# Patient Record
Sex: Male | Born: 1985 | Race: Black or African American | Hispanic: No | Marital: Married | Smoking: Current some day smoker
Health system: Southern US, Community
[De-identification: ages and names within clinical notes are randomized; demographics above are authoritative.]

## PROBLEM LIST (undated history)

## (undated) DIAGNOSIS — R569 Unspecified convulsions: Secondary | ICD-10-CM

## (undated) DIAGNOSIS — Z87442 Personal history of urinary calculi: Secondary | ICD-10-CM

## (undated) DIAGNOSIS — R200 Anesthesia of skin: Secondary | ICD-10-CM

## (undated) DIAGNOSIS — R202 Paresthesia of skin: Secondary | ICD-10-CM

## (undated) HISTORY — PX: CYSTOSCOPY: SUR368

---

## 2006-02-26 HISTORY — PX: EYE SURGERY: SHX253

## 2012-08-02 ENCOUNTER — Emergency Department (HOSPITAL_COMMUNITY)
Admission: EM | Admit: 2012-08-02 | Discharge: 2012-08-02 | Disposition: A | Discharge status: Home / Self Care | Payer: BC Managed Care – PPO | Attending: Emergency Medicine | Admitting: Emergency Medicine

## 2012-08-02 ENCOUNTER — Emergency Department (HOSPITAL_COMMUNITY): Payer: BC Managed Care – PPO

## 2012-08-02 DIAGNOSIS — N200 Calculus of kidney: Secondary | ICD-10-CM | POA: Insufficient documentation

## 2012-08-02 DIAGNOSIS — Z79899 Other long term (current) drug therapy: Secondary | ICD-10-CM | POA: Insufficient documentation

## 2012-08-02 LAB — CBC WITH DIFFERENTIAL/PLATELET
Basophils Absolute: 0 K/uL (ref 0.0–0.1)
Basophils Relative: 0 % (ref 0–1)
Eosinophils Absolute: 0 K/uL (ref 0.0–0.7)
Eosinophils Relative: 0 % (ref 0–5)
HCT: 40.7 % (ref 39.0–52.0)
Hemoglobin: 13.9 g/dL (ref 13.0–17.0)
Lymphocytes Relative: 13 % (ref 12–46)
Lymphs Abs: 1.5 K/uL (ref 0.7–4.0)
MCH: 30.8 pg (ref 26.0–34.0)
MCHC: 34.2 g/dL (ref 30.0–36.0)
MCV: 90.2 fL (ref 78.0–100.0)
Monocytes Absolute: 0.5 K/uL (ref 0.1–1.0)
Monocytes Relative: 4 % (ref 3–12)
Neutro Abs: 9.3 K/uL — ABNORMAL HIGH (ref 1.7–7.7)
Neutrophils Relative %: 83 % — ABNORMAL HIGH (ref 43–77)
Platelets: 193 K/uL (ref 150–400)
RBC: 4.51 MIL/uL (ref 4.22–5.81)
RDW: 13.1 % (ref 11.5–15.5)
WBC: 11.3 K/uL — ABNORMAL HIGH (ref 4.0–10.5)

## 2012-08-02 LAB — URINALYSIS, ROUTINE W REFLEX MICROSCOPIC
Glucose, UA: NEGATIVE mg/dL
Ketones, ur: >80 mg/dL — AB
Leukocytes, UA: NEGATIVE
Nitrite: NEGATIVE
Protein, ur: NEGATIVE mg/dL
Urobilinogen, UA: 1 mg/dL (ref 0.0–1.0)

## 2012-08-02 LAB — URINE MICROSCOPIC-ADD ON

## 2012-08-02 LAB — POCT I-STAT, CHEM 8
Calcium, Ion: 1.17 mmol/L (ref 1.12–1.23)
Creatinine, Ser: 2 mg/dL — ABNORMAL HIGH (ref 0.50–1.35)
Glucose, Bld: 96 mg/dL (ref 70–99)
Hemoglobin: 13.3 g/dL (ref 13.0–17.0)
Potassium: 4 mEq/L (ref 3.5–5.1)

## 2012-08-02 LAB — COMPREHENSIVE METABOLIC PANEL
ALT: 40 U/L (ref 0–53)
AST: 67 U/L — ABNORMAL HIGH (ref 0–37)
Albumin: 4.5 g/dL (ref 3.5–5.2)
Calcium: 10.2 mg/dL (ref 8.4–10.5)
Creatinine, Ser: 1.82 mg/dL — ABNORMAL HIGH (ref 0.50–1.35)
GFR calc non Af Amer: 49 mL/min — ABNORMAL LOW (ref >90)
Sodium: 137 mEq/L (ref 135–145)
Total Protein: 7.4 g/dL (ref 6.0–8.3)

## 2012-08-02 MED ORDER — SODIUM CHLORIDE 0.9 % IV SOLN
INTRAVENOUS | Status: DC
  Administered 2012-08-02: 21:00:00 via INTRAVENOUS

## 2012-08-02 MED ORDER — OXYCODONE-ACETAMINOPHEN 7.5-325 MG PO TABS: 1.0000 | ORAL_TABLET | ORAL | Status: AC | PRN

## 2012-08-02 MED ORDER — KETOROLAC TROMETHAMINE 30 MG/ML IJ SOLN
30.0000 mg | Freq: Once | INTRAMUSCULAR | Status: AC
  Administered 2012-08-02: 30 mg via INTRAVENOUS
  Filled 2012-08-02: qty 1

## 2012-08-02 MED ORDER — HYDROMORPHONE HCL PF 1 MG/ML IJ SOLN
1.0000 mg | Freq: Once | INTRAMUSCULAR | Status: AC
  Administered 2012-08-02: 1 mg via INTRAVENOUS
  Filled 2012-08-02: qty 1

## 2012-08-02 MED ORDER — TAMSULOSIN HCL 0.4 MG PO CAPS: 0.4000 mg | ORAL_CAPSULE | Freq: Every day | ORAL | Status: AC

## 2012-08-02 MED ORDER — ONDANSETRON HCL 4 MG/2ML IJ SOLN
4.0000 mg | Freq: Once | INTRAMUSCULAR | Status: AC
  Administered 2012-08-02: 4 mg via INTRAVENOUS
  Filled 2012-08-02: qty 2

## 2012-08-02 NOTE — ED Notes (Signed)
Pt states c/o left side flank pain with difficulty voiding (states able to void unable to push). Denies hematuria.

## 2012-08-05 ENCOUNTER — Telehealth (HOSPITAL_COMMUNITY): Payer: Self-pay | Admitting: Emergency Medicine

## 2012-08-05 NOTE — ED Notes (Signed)
FM was asked by Dr Radford Pax to call pt to have him return for a recheck and to repeat blood work if he has'nt been seen by an Insurance underwriter.  Spoke w/pt asked him to return for f/u and repeat blood work to [employer] or [employer].  Pt stated he will return today.

## 2012-08-05 NOTE — ED Provider Notes (Signed)
History     CSN: [id number]  Arrival date & time 08/02/12  1925   First MD Initiated Contact with Patient 08/02/12 1948      Chief Complaint  Patient presents with  . Flank Pain     HPI Patient began having left-sided flank pain or difficulty voiding started today.  No previous history of kidney stones.  Patient is no other previous medical problems.  No current medications.  Patient denies fever chills or dysuria. No past medical history on file.  No past surgical history on file.  No family history on file.  History  Substance Use Topics  . Smoking status: Not on file  . Smokeless tobacco: Not on file  . Alcohol Use: Not on file      Review of Systems All other systems reviewed and are negative Allergies  Review of patient's allergies indicates no known allergies.  Home Medications   Current Outpatient Rx  Name  Route  Sig  Dispense  Refill  . ibuprofen (ADVIL,MOTRIN) 200 MG tablet   Oral   Take 400 mg by mouth every 6 (six) hours as needed for pain.         Marland Kitchen oxyCODONE-acetaminophen (PERCOCET) 7.5-325 MG per tablet   Oral   Take 1 tablet by mouth every 4 (four) hours as needed for pain.   30 tablet   0   . tamsulosin (FLOMAX) 0.4 MG CAPS   Oral   Take 1 capsule (0.4 mg total) by mouth daily.   20 capsule   0     BP 116/67  Pulse 71  Temp(Src) 98.3 F (36.8 C) (Oral)  Resp 18  SpO2 100%  Physical Exam  Nursing note and vitals reviewed. Constitutional: He is oriented to person, place, and time. He appears well-developed and well-nourished. He appears distressed (Patient uncomfortable with pain).  HENT:  Head: Normocephalic and atraumatic.  Eyes: Pupils are equal, round, and reactive to light.  Neck: Normal range of motion.  Cardiovascular: Normal rate and intact distal pulses.   Pulmonary/Chest: No respiratory distress.  Abdominal: Normal appearance. He exhibits no distension. There is no tenderness. There is no rebound.  Musculoskeletal:  Normal range of motion.  Neurological: He is alert and oriented to person, place, and time. No cranial nerve deficit.  Skin: Skin is warm and dry. No rash noted.  Psychiatric: He has a normal mood and affect. His behavior is normal.    ED Course  Procedures (including critical care time)  Labs Reviewed  URINALYSIS, ROUTINE W REFLEX MICROSCOPIC - Abnormal; Notable for the following:    APPearance TURBID (*)    Specific Gravity, Urine 1.031 (*)    Hgb urine dipstick TRACE (*)    Bilirubin Urine SMALL (*)    Ketones, ur >80 (*)    All other components within normal limits  COMPREHENSIVE METABOLIC PANEL - Abnormal; Notable for the following:    Glucose, Bld 102 (*)    Creatinine, Ser 1.82 (*)    AST 67 (*)    GFR calc non Af Amer 49 (*)    GFR calc Af Amer 57 (*)    All other components within normal limits  CBC WITH DIFFERENTIAL - Abnormal; Notable for the following:    WBC 11.3 (*)    Neutrophils Relative % 83 (*)    Neutro Abs 9.3 (*)    All other components within normal limits  POCT I-STAT, CHEM 8 - Abnormal; Notable for the following:    Creatinine,  Ser 2.00 (*)    All other components within normal limits  URINE MICROSCOPIC-ADD ON   No results found.   1. Kidney stone       MDM  Patient felt much improved and wanted to go home.  Followup instructions with referral to urology was made.  Patient to return if pain worsens or he develops nausea vomiting or fever.  Patient given urology followup.  Review of creatinine function dictates a return call or followup phone call to assure the patient is promptly followed.  It is not seen her urologist requested a return to emergency department for reevaluation and repeat renal function evaluation.      Nelia Shi, MD 08/05/12 [phone]

## 2016-06-24 ENCOUNTER — Encounter (HOSPITAL_COMMUNITY): Payer: Self-pay

## 2016-06-24 ENCOUNTER — Emergency Department (HOSPITAL_COMMUNITY): Payer: Managed Care, Other (non HMO)

## 2016-06-24 ENCOUNTER — Emergency Department (HOSPITAL_COMMUNITY)
Admission: EM | Admit: 2016-06-24 | Discharge: 2016-06-24 | Disposition: A | Discharge status: Home / Self Care | Payer: Managed Care, Other (non HMO) | Attending: Emergency Medicine | Admitting: Emergency Medicine

## 2016-06-24 DIAGNOSIS — Y9367 Activity, basketball: Secondary | ICD-10-CM | POA: Insufficient documentation

## 2016-06-24 DIAGNOSIS — S99911A Unspecified injury of right ankle, initial encounter: Secondary | ICD-10-CM | POA: Diagnosis present

## 2016-06-24 DIAGNOSIS — Y999 Unspecified external cause status: Secondary | ICD-10-CM | POA: Insufficient documentation

## 2016-06-24 DIAGNOSIS — Y929 Unspecified place or not applicable: Secondary | ICD-10-CM | POA: Diagnosis not present

## 2016-06-24 DIAGNOSIS — S86011A Strain of right Achilles tendon, initial encounter: Secondary | ICD-10-CM | POA: Diagnosis not present

## 2016-06-24 DIAGNOSIS — X58XXXA Exposure to other specified factors, initial encounter: Secondary | ICD-10-CM | POA: Insufficient documentation

## 2016-06-24 MED ORDER — ACETAMINOPHEN 325 MG PO TABS: 650.0000 mg | ORAL_TABLET | Freq: Four times a day (QID) | ORAL | 0 refills | Status: AC | PRN

## 2016-06-24 MED ORDER — IBUPROFEN 600 MG PO TABS: 600.0000 mg | ORAL_TABLET | Freq: Four times a day (QID) | ORAL | 0 refills | Status: AC | PRN

## 2016-06-24 NOTE — ED Notes (Signed)
Declined W/C at D/C and was escorted to lobby by RN. 

## 2016-06-24 NOTE — ED Provider Notes (Signed)
[facility]-EMERGENCY DEPT Provider Note    By signing my name below, I, Kenneth Reid, attest that this documentation has been prepared under the direction and in the presence of Derwood Kaplan, MD. Electronically Signed: Earmon Reid, ED Scribe. 06/24/16. 4:17 PM.    History   Chief Complaint No chief complaint on file.  The history is provided by the patient and medical records. No language interpreter was used.    Jorge Retz is a 31 y.o. male who presents to the Emergency Department complaining of posterior right ankle pain that began about two hours ago while playing basketball. He reports hearing an associated popping sensation and some swelling of the right ankle. He has not taken anything for pain. Waking increases the pain. He denies alleviating factors. He denies numbness, tingling or weakness of the RLE or foot, bruising or wounds.   History reviewed. No pertinent past medical history.  There are no active problems to display for this patient.   History reviewed. No pertinent surgical history.     Home Medications    Prior to Admission medications   Medication Sig Start Date End Date Taking? Authorizing Provider  acetaminophen (TYLENOL) 325 MG tablet Take 2 tablets (650 mg total) by mouth every 6 (six) hours as needed. 06/24/16   Derwood Kaplan, MD  ibuprofen (ADVIL,MOTRIN) 600 MG tablet Take 1 tablet (600 mg total) by mouth every 6 (six) hours as needed. 06/24/16   Derwood Kaplan, MD  oxyCODONE-acetaminophen (PERCOCET) 7.5-325 MG per tablet Take 1 tablet by mouth every 4 (four) hours as needed for pain. 08/02/12   Nelva Nay, MD  tamsulosin (FLOMAX) 0.4 MG CAPS Take 1 capsule (0.4 mg total) by mouth daily. 08/02/12   Nelva Nay, MD    Family History No family history on file.  Social History Social History  Substance Use Topics  . Smoking status: Never Smoker  . Smokeless tobacco: Never Used  . Alcohol use Not on file     Allergies   Patient has no  known allergies.   Review of Systems Review of Systems  Musculoskeletal: Positive for arthralgias.  Skin: Negative for color change and wound.  Neurological: Negative for weakness and numbness.     Physical Exam Updated Vital Signs BP 116/86   Pulse 80   Temp 98.9 F (37.2 C) (Oral)   Resp 18   SpO2 100%   Physical Exam  Constitutional: He is oriented to person, place, and time. He appears well-developed and well-nourished.  HENT:  Head: Normocephalic.  Eyes: EOM are normal.  Neck: Normal range of motion.  Pulmonary/Chest: Effort normal.  Abdominal: He exhibits no distension.  Musculoskeletal: Normal range of motion.  No tenderness to palpation over anterior, medial or lateral malleoli or anterior ankle. Able to plantar and dorsiflex with discomfort. Positive Thompson's test.  Neurological: He is alert and oriented to person, place, and time.  Psychiatric: He has a normal mood and affect.  Nursing note and vitals reviewed.    ED Treatments / Results  DIAGNOSTIC STUDIES: Oxygen Saturation is 100% on RA, normal by my interpretation.   COORDINATION OF CARE: 3:50 PM- Will do bedside ultrasound. Pt verbalizes understanding and agrees to plan.  Medications - No data to display  Labs (all labs ordered are listed, but only abnormal results are displayed) Labs Reviewed - No data to display  EKG  EKG Interpretation None       Radiology Dg Ankle Complete Right  Result Date: 06/24/2016 CLINICAL DATA:  Posterior right ankle  pain. EXAM: RIGHT ANKLE - COMPLETE 3+ VIEW COMPARISON:  None. FINDINGS: There is no evidence of fracture, dislocation, or joint effusion. There is no evidence of arthropathy or other focal bone abnormality. Mild soft tissue thickening is seen posteriorly, slightly superior to the level of the ankle mortise. IMPRESSION: No acute fracture or dislocation identified about the right ankle. Posterior soft tissue edema. Electronically Signed   By: Ted Mcalpine M.D.   On: 06/24/2016 15:33    Procedures [place] bedside Date/Time: 06/24/2016 4:16 PM Performed by: Derwood Kaplan Authorized by: Derwood Kaplan  Consent: Verbal consent obtained. Consent given by: patient Patient understanding: patient states understanding of the procedure being performed Patient consent: the patient's understanding of the procedure matches consent given Procedure consent: procedure consent matches procedure scheduled Relevant documents: relevant documents present and verified Test results: test results available and properly labeled Imaging studies: imaging studies available Required items: required blood products, implants, devices, and special equipment available Patient identity confirmed: verbally with patient Preparation: Patient was prepped and draped in the usual sterile fashion. Local anesthesia used: no  Anesthesia: Local anesthesia used: no  Sedation: Patient sedated: no Patient tolerance: Patient tolerated the procedure well with no immediate complications Comments: ULTRASOUND LIMITED SOFT TISSUE/ MUSCULOSKELETAL: right achilles Indication: right ankle injury Linear probe used to evaluate area of interest in two planes. Findings:  Clear evidence of tendon fibers not extending to the distal leg, and edema Performed by: Dr Rhunette Croft Images saved electronically     (including critical care time)  Medications Ordered in ED Medications - No data to display   Initial Impression / Assessment and Plan / ED Course  I have reviewed the triage vital signs and the nursing notes.  Pertinent labs & imaging results that were available during my care of the patient were reviewed by me and considered in my medical decision making (see chart for details).    Pt has likely suffered achilles tendon rupture. We will d.c with ortho f/u. Non weight bearing.   Final Clinical Impressions(s) / ED Diagnoses   Final diagnoses:  Rupture of right  Achilles tendon, initial encounter    New Prescriptions New Prescriptions   ACETAMINOPHEN (TYLENOL) 325 MG TABLET    Take 2 tablets (650 mg total) by mouth every 6 (six) hours as needed.   IBUPROFEN (ADVIL,MOTRIN) 600 MG TABLET    Take 1 tablet (600 mg total) by mouth every 6 (six) hours as needed.    I personally performed the services described in this documentation, which was scribed in my presence. The recorded information has been reviewed and is accurate.     Derwood Kaplan, MD 06/24/16 [phone]

## 2016-06-24 NOTE — Progress Notes (Signed)
Orthopedic Tech Progress Note Patient Details:  Kenneth Reid [birth year] [id number]  Ortho Devices Type of Ortho Device: Ace wrap, Post (short leg) splint Ortho Device/Splint Location: RLE Ortho Device/Splint Interventions: Ordered, Application   Jennye Moccasin 06/24/2016, 5:18 PM

## 2016-06-24 NOTE — ED Triage Notes (Signed)
Patient complains of posterior right ankle pain after injuring same playing basketball, pain with ambulation

## 2016-06-24 NOTE — Discharge Instructions (Signed)
You likely have rupture of the achilles tendon. NO WEIGHT BEARING - USE THE CRUTCHES. KEEP THE LEG IN SPLINT AND SEE ORTHOPEDIC doctor in 1 week.  Keep the leg elevated.

## 2016-06-24 NOTE — Progress Notes (Signed)
Orthopedic Tech Progress Note Patient Details:  Winfield Weathersby 10/24/1985 [id number]  Ortho Devices Type of Ortho Device: Ace wrap, Post (short leg) splint Ortho Device/Splint Location: RLE Ortho Device/Splint Interventions: Ordered, Application   Dorthea Maina Craig 06/24/2016, 5:18 PM  

## 2016-06-24 NOTE — Progress Notes (Signed)
Orthopedic Tech Progress Note Patient Details:  Kenneth Reid 1985-04-20 [id number]  Ortho Devices Type of Ortho Device: ASO, Crutches Ortho Device/Splint Location: RLE Ortho Device/Splint Interventions: Ordered, Application   Jennye Moccasin 06/24/2016, 4:54 PM

## 2016-07-11 ENCOUNTER — Encounter (HOSPITAL_BASED_OUTPATIENT_CLINIC_OR_DEPARTMENT_OTHER): Payer: Self-pay | Admitting: *Deleted

## 2016-07-11 NOTE — Progress Notes (Signed)
Pt instructed npo pmn 5/21.  [facility] 1045.  Needs hgb on arrival.

## 2016-07-17 ENCOUNTER — Ambulatory Visit (HOSPITAL_BASED_OUTPATIENT_CLINIC_OR_DEPARTMENT_OTHER): Payer: Managed Care, Other (non HMO) | Admitting: Anesthesiology

## 2016-07-17 ENCOUNTER — Ambulatory Visit (HOSPITAL_BASED_OUTPATIENT_CLINIC_OR_DEPARTMENT_OTHER)
Admission: RE | Admit: 2016-07-17 | Discharge: 2016-07-17 | Disposition: A | Discharge status: Home / Self Care | Payer: Managed Care, Other (non HMO) | Source: Ambulatory Visit | Attending: Orthopedic Surgery | Admitting: Orthopedic Surgery

## 2016-07-17 ENCOUNTER — Encounter (HOSPITAL_BASED_OUTPATIENT_CLINIC_OR_DEPARTMENT_OTHER)
Admission: RE | Disposition: A | Discharge status: Home / Self Care | Payer: Self-pay | Source: Ambulatory Visit | Attending: Orthopedic Surgery

## 2016-07-17 ENCOUNTER — Encounter (HOSPITAL_BASED_OUTPATIENT_CLINIC_OR_DEPARTMENT_OTHER): Payer: Self-pay | Admitting: Anesthesiology

## 2016-07-17 DIAGNOSIS — F17200 Nicotine dependence, unspecified, uncomplicated: Secondary | ICD-10-CM | POA: Insufficient documentation

## 2016-07-17 DIAGNOSIS — S86011A Strain of right Achilles tendon, initial encounter: Secondary | ICD-10-CM | POA: Diagnosis present

## 2016-07-17 DIAGNOSIS — Y9239 Other specified sports and athletic area as the place of occurrence of the external cause: Secondary | ICD-10-CM | POA: Diagnosis not present

## 2016-07-17 DIAGNOSIS — Z87442 Personal history of urinary calculi: Secondary | ICD-10-CM | POA: Insufficient documentation

## 2016-07-17 DIAGNOSIS — Y9367 Activity, basketball: Secondary | ICD-10-CM | POA: Diagnosis not present

## 2016-07-17 HISTORY — DX: Anesthesia of skin: R20.0

## 2016-07-17 HISTORY — DX: Unspecified convulsions: R56.9

## 2016-07-17 HISTORY — PX: ACHILLES TENDON SURGERY: SHX542

## 2016-07-17 HISTORY — DX: Paresthesia of skin: R20.2

## 2016-07-17 HISTORY — DX: Personal history of urinary calculi: Z87.442

## 2016-07-17 LAB — POCT HEMOGLOBIN-HEMACUE: Hemoglobin: 14.5 g/dL (ref 13.0–17.0)

## 2016-07-17 SURGERY — REPAIR, TENDON, ACHILLES
Anesthesia: General | Site: Leg Lower | Laterality: Right

## 2016-07-17 MED ORDER — PROPOFOL 10 MG/ML IV BOLUS
INTRAVENOUS | Status: DC | PRN
  Administered 2016-07-17: 250 mg via INTRAVENOUS

## 2016-07-17 MED ORDER — SUCCINYLCHOLINE CHLORIDE 20 MG/ML IJ SOLN
INTRAMUSCULAR | Status: DC | PRN
  Administered 2016-07-17: 120 mg via INTRAVENOUS

## 2016-07-17 MED ORDER — LIDOCAINE HCL (CARDIAC) 20 MG/ML IV SOLN
INTRAVENOUS | Status: DC | PRN
  Administered 2016-07-17: 100 mg via INTRAVENOUS

## 2016-07-17 MED ORDER — FENTANYL CITRATE (PF) 100 MCG/2ML IJ SOLN
INTRAMUSCULAR | Status: AC
  Filled 2016-07-17: qty 2

## 2016-07-17 MED ORDER — PROPOFOL 10 MG/ML IV BOLUS
INTRAVENOUS | Status: AC
  Filled 2016-07-17: qty 20

## 2016-07-17 MED ORDER — HYDROMORPHONE HCL 1 MG/ML IJ SOLN
INTRAMUSCULAR | Status: AC
  Filled 2016-07-17: qty 0.5

## 2016-07-17 MED ORDER — ASPIRIN EC 325 MG PO TBEC: 325.0000 mg | DELAYED_RELEASE_TABLET | Freq: Two times a day (BID) | ORAL | 0 refills | Status: AC

## 2016-07-17 MED ORDER — SUCCINYLCHOLINE CHLORIDE 200 MG/10ML IV SOSY
PREFILLED_SYRINGE | INTRAVENOUS | Status: AC
  Filled 2016-07-17: qty 10

## 2016-07-17 MED ORDER — OXYCODONE HCL 5 MG PO TABS: 5.0000 mg | ORAL_TABLET | ORAL | 0 refills | Status: AC | PRN

## 2016-07-17 MED ORDER — ONDANSETRON HCL 4 MG/2ML IJ SOLN
INTRAMUSCULAR | Status: AC
  Filled 2016-07-17: qty 2

## 2016-07-17 MED ORDER — ONDANSETRON 4 MG PO TBDP: 4.0000 mg | ORAL_TABLET | Freq: Three times a day (TID) | ORAL | 0 refills | Status: AC | PRN

## 2016-07-17 MED ORDER — BUPIVACAINE HCL 0.25 % IJ SOLN
INTRAMUSCULAR | Status: DC | PRN
  Administered 2016-07-17: 10 mL

## 2016-07-17 MED ORDER — MEPERIDINE HCL 25 MG/ML IJ SOLN
6.2500 mg | INTRAMUSCULAR | Status: DC | PRN
Start: 2016-07-17 — End: 2016-07-18
  Filled 2016-07-17: qty 1

## 2016-07-17 MED ORDER — MIDAZOLAM HCL 2 MG/2ML IJ SOLN
INTRAMUSCULAR | Status: AC
  Filled 2016-07-17: qty 2

## 2016-07-17 MED ORDER — MIDAZOLAM HCL 5 MG/5ML IJ SOLN
INTRAMUSCULAR | Status: DC | PRN
  Administered 2016-07-17: 2 mg via INTRAVENOUS

## 2016-07-17 MED ORDER — DEXAMETHASONE SODIUM PHOSPHATE 4 MG/ML IJ SOLN
INTRAMUSCULAR | Status: DC | PRN
  Administered 2016-07-17: 10 mg via INTRAVENOUS

## 2016-07-17 MED ORDER — ONDANSETRON HCL 4 MG/2ML IJ SOLN
INTRAMUSCULAR | Status: DC | PRN
  Administered 2016-07-17: 4 mg via INTRAVENOUS

## 2016-07-17 MED ORDER — HYDROMORPHONE HCL 1 MG/ML IJ SOLN
0.2500 mg | INTRAMUSCULAR | Status: DC | PRN
  Administered 2016-07-17: 0.5 mg via INTRAVENOUS
  Filled 2016-07-17: qty 0.5

## 2016-07-17 MED ORDER — FENTANYL CITRATE (PF) 100 MCG/2ML IJ SOLN
100.0000 ug | Freq: Once | INTRAMUSCULAR | Status: AC
  Administered 2016-07-17: 100 ug via INTRAVENOUS
  Filled 2016-07-17: qty 2

## 2016-07-17 MED ORDER — CEFAZOLIN SODIUM-DEXTROSE 2-4 GM/100ML-% IV SOLN
INTRAVENOUS | Status: AC
  Filled 2016-07-17: qty 100

## 2016-07-17 MED ORDER — CEFAZOLIN SODIUM-DEXTROSE 2-4 GM/100ML-% IV SOLN
2.0000 g | INTRAVENOUS | Status: AC
  Administered 2016-07-17: 2 g via INTRAVENOUS
  Filled 2016-07-17: qty 100

## 2016-07-17 MED ORDER — LACTATED RINGERS IV SOLN
INTRAVENOUS | Status: DC
  Administered 2016-07-17 (×2): via INTRAVENOUS
  Filled 2016-07-17: qty 1000

## 2016-07-17 MED ORDER — KETOROLAC TROMETHAMINE 30 MG/ML IJ SOLN
30.0000 mg | Freq: Once | INTRAMUSCULAR | Status: DC | PRN
  Administered 2016-07-17: 30 mg via INTRAVENOUS
  Filled 2016-07-17: qty 1

## 2016-07-17 MED ORDER — LIDOCAINE 2% (20 MG/ML) 5 ML SYRINGE
INTRAMUSCULAR | Status: AC
  Filled 2016-07-17: qty 5

## 2016-07-17 MED ORDER — MIDAZOLAM HCL 2 MG/2ML IJ SOLN
2.0000 mg | Freq: Once | INTRAMUSCULAR | Status: AC
Start: 2016-07-17 — End: 2016-07-17
  Administered 2016-07-17: 2 mg via INTRAVENOUS
  Filled 2016-07-17: qty 2

## 2016-07-17 MED ORDER — CHLORHEXIDINE GLUCONATE 4 % EX LIQD
60.0000 mL | Freq: Once | CUTANEOUS | Status: DC
  Filled 2016-07-17: qty 118

## 2016-07-17 MED ORDER — OXYCODONE HCL 5 MG/5ML PO SOLN
5.0000 mg | Freq: Once | ORAL | Status: AC | PRN
  Filled 2016-07-17: qty 5

## 2016-07-17 MED ORDER — FENTANYL CITRATE (PF) 100 MCG/2ML IJ SOLN
INTRAMUSCULAR | Status: DC | PRN
  Administered 2016-07-17 (×4): 25 ug via INTRAVENOUS

## 2016-07-17 MED ORDER — PROMETHAZINE HCL 25 MG/ML IJ SOLN
6.2500 mg | INTRAMUSCULAR | Status: DC | PRN
  Filled 2016-07-17: qty 1

## 2016-07-17 MED ORDER — DEXAMETHASONE SODIUM PHOSPHATE 10 MG/ML IJ SOLN
INTRAMUSCULAR | Status: AC
  Filled 2016-07-17: qty 1

## 2016-07-17 MED ORDER — OXYCODONE HCL 5 MG PO TABS
ORAL_TABLET | ORAL | Status: AC
  Filled 2016-07-17: qty 1

## 2016-07-17 MED ORDER — OXYCODONE HCL 5 MG PO TABS
5.0000 mg | ORAL_TABLET | Freq: Once | ORAL | Status: AC | PRN
  Administered 2016-07-17: 5 mg via ORAL
  Filled 2016-07-17: qty 1

## 2016-07-17 MED ORDER — KETOROLAC TROMETHAMINE 30 MG/ML IJ SOLN
INTRAMUSCULAR | Status: AC
  Filled 2016-07-17: qty 1

## 2016-07-17 SURGICAL SUPPLY — 71 items
ARTHREX ×3 IMPLANT
BANDAGE ACE 4X5 VEL STRL LF (GAUZE/BANDAGES/DRESSINGS) ×3 IMPLANT
BANDAGE ACE 6X5 VEL STRL LF (GAUZE/BANDAGES/DRESSINGS) ×3 IMPLANT
BANDAGE ESMARK 6X9 LF (GAUZE/BANDAGES/DRESSINGS) IMPLANT
BLADE SURG 15 STRL LF DISP TIS (BLADE) ×1 IMPLANT
BLADE SURG 15 STRL SS (BLADE) ×2
BNDG COHESIVE 4X5 TAN NS LF (GAUZE/BANDAGES/DRESSINGS) IMPLANT
BNDG ESMARK 6X9 LF (GAUZE/BANDAGES/DRESSINGS)
CANISTER SUCT 3000ML PPV (MISCELLANEOUS) ×3 IMPLANT
CLOSURE WOUND 1/2 X4 (GAUZE/BANDAGES/DRESSINGS)
COVER BACK TABLE 60X90IN (DRAPES) ×3 IMPLANT
CUFF TOURN SGL QUICK 18 (TOURNIQUET CUFF) IMPLANT
CUFF TOURN SGL QUICK 34 (TOURNIQUET CUFF) ×2
CUFF TRNQT CYL 34X4X40X1 (TOURNIQUET CUFF) ×1 IMPLANT
DRAPE EXTREMITY T 121X128X90 (DRAPE) IMPLANT
DRAPE LG THREE QUARTER DISP (DRAPES) ×3 IMPLANT
DRAPE ORTHO SPLIT 77X108 STRL (DRAPES) ×2
DRAPE SURG ORHT 6 SPLT 77X108 (DRAPES) ×1 IMPLANT
DRAPE U-SHAPE 47X51 STRL (DRAPES) ×3 IMPLANT
DRSG PAD ABDOMINAL 8X10 ST (GAUZE/BANDAGES/DRESSINGS) ×6 IMPLANT
DURAPREP 26ML APPLICATOR (WOUND CARE) ×3 IMPLANT
ELECT REM PT RETURN 15FT ADLT (MISCELLANEOUS) IMPLANT
GAUZE SPONGE 4X4 12PLY STRL LF (GAUZE/BANDAGES/DRESSINGS) ×3 IMPLANT
GAUZE XEROFORM 1X8 LF (GAUZE/BANDAGES/DRESSINGS) ×6 IMPLANT
GLOVE BIOGEL M 7.0 STRL (GLOVE) IMPLANT
GLOVE BIOGEL PI IND STRL 7.5 (GLOVE) ×1 IMPLANT
GLOVE BIOGEL PI IND STRL 8.5 (GLOVE) ×1 IMPLANT
GLOVE BIOGEL PI INDICATOR 7.5 (GLOVE) ×2
GLOVE BIOGEL PI INDICATOR 8.5 (GLOVE) ×2
GLOVE ECLIPSE 8.0 STRL XLNG CF (GLOVE) IMPLANT
GLOVE ORTHO TXT STRL SZ7.5 (GLOVE) ×6 IMPLANT
GLOVE SURG ORTHO 8.0 STRL STRW (GLOVE) ×3 IMPLANT
GOWN STRL REUS W/TWL LRG LVL3 (GOWN DISPOSABLE) ×3 IMPLANT
GOWN STRL REUS W/TWL XL LVL3 (GOWN DISPOSABLE) ×6 IMPLANT
KIT IMPLANT SUTURE PARS (Kit) ×3 IMPLANT
MANIFOLD NEPTUNE II (INSTRUMENTS) IMPLANT
NEEDLE HYPO 22GX1.5 SAFETY (NEEDLE) ×3 IMPLANT
NS IRRIG 500ML POUR BTL (IV SOLUTION) ×3 IMPLANT
PACK BASIN DAY SURGERY FS (CUSTOM PROCEDURE TRAY) ×3 IMPLANT
PAD CAST 4YDX4 CTTN HI CHSV (CAST SUPPLIES) ×2 IMPLANT
PAD PREP 24X48 CUFFED NSTRL (MISCELLANEOUS) IMPLANT
PADDING CAST COTTON 4X4 STRL (CAST SUPPLIES) ×4
PASSER SUT SWANSON 36MM LOOP (INSTRUMENTS) IMPLANT
PENCIL BUTTON HOLSTER BLD 10FT (ELECTRODE) ×3 IMPLANT
SPLINT FAST PLASTER 5X30 (CAST SUPPLIES)
SPLINT FIBERGLASS 4X30 (CAST SUPPLIES) ×3 IMPLANT
SPLINT PLASTER CAST FAST 5X30 (CAST SUPPLIES) IMPLANT
SPONGE LAP 4X18 X RAY DECT (DISPOSABLE) ×3 IMPLANT
STOCKINETTE 6  STRL (DRAPES) ×2
STOCKINETTE 6 STRL (DRAPES) ×1 IMPLANT
STRIP CLOSURE SKIN 1/2X4 (GAUZE/BANDAGES/DRESSINGS) IMPLANT
SUCTION FRAZIER HANDLE 10FR (MISCELLANEOUS) ×2
SUCTION TUBE FRAZIER 10FR DISP (MISCELLANEOUS) ×1 IMPLANT
SUT ETHILON 2 0 PS N (SUTURE) ×3 IMPLANT
SUT ETHILON 3 0 PS 1 (SUTURE) ×3 IMPLANT
SUT FIBERWIRE #2 38 T-5 BLUE (SUTURE)
SUT MNCRL AB 3-0 PS2 18 (SUTURE) ×3 IMPLANT
SUT MNCRL AB 4-0 PS2 18 (SUTURE) ×3 IMPLANT
SUT VIC AB 1 CT1 27 (SUTURE) ×6
SUT VIC AB 1 CT1 27XBRD ANTBC (SUTURE) ×3 IMPLANT
SUT VIC AB 2-0 CT1 27 (SUTURE)
SUT VIC AB 2-0 CT1 TAPERPNT 27 (SUTURE) IMPLANT
SUT VIC AB 2-0 SH 27 (SUTURE) ×2
SUT VIC AB 2-0 SH 27XBRD (SUTURE) ×1 IMPLANT
SUTURE FIBERWR #2 38 T-5 BLUE (SUTURE) IMPLANT
SYR BULB IRRIGATION 50ML (SYRINGE) ×3 IMPLANT
SYR CONTROL 10ML LL (SYRINGE) ×3 IMPLANT
TOWEL OR 17X24 6PK STRL BLUE (TOWEL DISPOSABLE) ×6 IMPLANT
TUBE CONNECTING 12'X1/4 (SUCTIONS) ×1
TUBE CONNECTING 12X1/4 (SUCTIONS) ×2 IMPLANT
WATER STERILE IRR 500ML POUR (IV SOLUTION) ×3 IMPLANT

## 2016-07-17 NOTE — Transfer of Care (Signed)
Immediate Anesthesia Transfer of Care Note  Patient: Oneida ArenasJonathan Sheffler  Procedure(s) Performed: Procedure(s) (LRB): ACHILLES TENDON REPAIR (Right)  Patient Location: PACU  Anesthesia Type: General  Level of Consciousness: awake, oriented, sedated and patient cooperative  Airway & Oxygen Therapy: Patient Spontanous Breathing and Patient connected to face mask oxygen  Post-op Assessment: Report given to PACU RN and Post -op Vital signs reviewed and stable  Post vital signs: Reviewed and stable  Complications: No apparent anesthesia complications  Last Vitals:  Vitals:   07/17/16 1245 07/17/16 1445  BP: 129/79 134/82  Pulse: 71 88  Resp: 14 10  Temp:  36.4 C    Last Pain:  Vitals:   07/17/16 1105  TempSrc: Oral

## 2016-07-17 NOTE — Discharge Instructions (Signed)
-  no weight bearing to the right leg -keep right leg elevated -keep splint clean and dry to the right ankle -take 325 mg aspirin twice daily for one month for prevention of blood clots -return in 2 weeks to see MD  Post Anesthesia Home Care Instructions  Activity: Get plenty of rest for the remainder of the day. A responsible individual must stay with you for 24 hours following the procedure.  For the next 24 hours, DO NOT: -Drive a car -Advertising copywriterperate machinery -Drink alcoholic beverages -Take any medication unless instructed by your physician -Make any legal decisions or sign important papers.  Meals: Start with liquid foods such as gelatin or soup. Progress to regular foods as tolerated. Avoid greasy, spicy, heavy foods. If nausea and/or vomiting occur, drink only clear liquids until the nausea and/or vomiting subsides. Call your physician if vomiting continues.  Special Instructions/Symptoms: Your throat may feel dry or sore from the anesthesia or the breathing tube placed in your throat during surgery. If this causes discomfort, gargle with warm salt water. The discomfort should disappear within 24 hours.  If you had a scopolamine patch placed behind your ear for the management of post- operative nausea and/or vomiting:  1. The medication in the patch is effective for 72 hours, after which it should be removed.  Wrap patch in a tissue and discard in the trash. Wash hands thoroughly with soap and water. 2. You may remove the patch earlier than 72 hours if you experience unpleasant side effects which may include dry mouth, dizziness or visual disturbances. 3. Avoid touching the patch. Wash your hands with soap and water after contact with the patch.

## 2016-07-17 NOTE — Op Note (Signed)
Date of Surgery: 07/17/2016  INDICATIONS: Mr. Samuella Cotarice is a 31 y.o.-year-old male who sustained an acute right achilles rupture while playing basketball. He was evaluated in my clinic and after a long discussion of non operative versus operative treatment he desired operative intervention for a chance to weight bear sooner.  The risks and benefits of the procedure discussed with the patient prior to the procedure and all questions were answered; consent was obtained.  Risks included, bleeding, infection, nerve and blood vessel injury, re rupture, wound dehiscence, blood clots and risks of anesthesia.  He desired to move forward with surgery.  PREOPERATIVE DIAGNOSIS: right achilles rupture, acute  POSTOPERATIVE DIAGNOSIS: Same   PROCEDURE: Treatment of right achilles rupture , without graft  SURGEON: Maryan RuedJason P Sahily Biddle, M.D.   ANESTHESIA: general   IV FLUIDS AND URINE: See anesthesia record   ESTIMATED BLOOD LOSS: 20 cc  IMPLANTS: [employer] PARS system  COMPLICATIONS: None.   DESCRIPTION OF PROCEDURE: The patient was brought to the operating room and placed prone on the operating table. The patient's leg had been signed prior to the procedure. The patient had the anesthesia placed by the anesthesiologist. The prep verification and incision time-outs were performed to confirm that this was the correct patient, site, side and location. The patient had an SCD on the opposite lower extremity. A 2 cm longitudinal incision based over the rupture was used.  Blunt dissection was taken down to the level of the paratenon.  The paratenon was sharply incised in line with the incision.  The rupture was exposed.  A space was created between the paratenon and the achilles tendon on both sides going up and down the tendon.  The sural nerve was visualized and protected during the procedure.  An alise clamp was used to pull tension on the tendon end.  The sled was advanced up the tendon proximally making sure the tendon  was straddled between the arms.  The sutures were passed sequentially and then brought out through the incision.  The same steps were then repeated for the distal portion of the tendon.  The foot was then placed in maximum plantarflexion and the sutures were tied down.  The wound was then irrigated thoroughly.  The paratenon was was closed using 0 vicryl.  The subcutaneous layer was closed with 2-0  monocryl and the skin with interrupted 3-0 nylon.  A sterile dressing was applied.  A short leg splint was placed with the ankle in maximum plantarflexion.  The patient awoke from anesthesia uneventfully and transported to the PACU.  POSTOPERATIVE PLAN: The patient will be non weight bearing for two weeks and return for suture removal and transition to a fracture boot.  The patient will be placed on DVT chemoprophylaxis with bid asa for 1 month.  Yolonda KidaJason Patrick Ziyanna Tolin

## 2016-07-17 NOTE — Anesthesia Procedure Notes (Signed)
Procedure Name: Intubation Date/Time: 07/17/2016 1:06 PM Performed by: Justice Rocher Pre-anesthesia Checklist: Patient identified, Emergency Drugs available, Suction available and Patient being monitored Patient Re-evaluated:Patient Re-evaluated prior to inductionOxygen Delivery Method: Circle system utilized Preoxygenation: Pre-oxygenation with 100% oxygen Intubation Type: IV induction Ventilation: Mask ventilation without difficulty Laryngoscope Size: Mac and 4 Grade View: Grade II Tube type: Oral Tube size: 8.0 mm Number of attempts: 1 Airway Equipment and Method: Stylet and Oral airway Placement Confirmation: ETT inserted through vocal cords under direct vision,  positive ETCO2 and breath sounds checked- equal and bilateral Secured at: 23 cm Tube secured with: Tape Dental Injury: Teeth and Oropharynx as per pre-operative assessment

## 2016-07-17 NOTE — Anesthesia Procedure Notes (Signed)
Anesthesia Regional Block: Popliteal block   Pre-Anesthetic Checklist: ,, timeout performed, Correct Patient, Correct Site, Correct Laterality, Correct Procedure, Correct Position, site marked, Risks and benefits discussed,  Surgical consent,  Pre-op evaluation,  At surgeon's request and post-op pain management  Laterality: Right  Prep: chloraprep       Needles:  Injection technique: Single-shot  Needle Type: Stimiplex     Needle Length: 10cm  Needle Gauge: 21     Additional Needles:   Procedures: ultrasound guided,,,,,,,,  Motor weakness within 5 minutes.   Nerve Stimulator or Paresthesia:  Response: Plantar flexion/toe flexion, 0.5 mA,   Additional Responses:   Narrative:  Start time: 07/17/2016 12:25 PM End time: 07/17/2016 12:30 PM Injection made incrementally with aspirations every 5 mL.  Performed by: Personally  Anesthesiologist: Lewie LoronGERMEROTH, [employer]  Additional Notes: Nerve located and needle positioned with direct ultrasound guidance. Good perineural spread. Patient tolerated well.

## 2016-07-17 NOTE — Brief Op Note (Signed)
07/17/2016  2:40 PM  PATIENT:  Kenneth Reid  31 y.o. male  PRE-OPERATIVE DIAGNOSIS:  RIGHT ACHILLES TENDON TEAR  POST-OPERATIVE DIAGNOSIS:  RIGHT ACHILLES TENDON TEAR  PROCEDURE:  Procedure(s): ACHILLES TENDON REPAIR (Right)  SURGEON:  Surgeon(s) and Role:    * Yolonda Kidaogers, Jason Patrick, MD - Primary  PHYSICIAN ASSISTANT:   ASSISTANTS: none   ANESTHESIA:   general and regional   EBL:  Total I/O In: 1000 [I.V.:1000] Out: 5 [Blood:5]  BLOOD ADMINISTERED:none  DRAINS: none   LOCAL MEDICATIONS USED:  MARCAINE     SPECIMEN:  No Specimen  DISPOSITION OF SPECIMEN:  N/A  COUNTS:  YES  TOURNIQUET:   Total Tourniquet Time Documented: Thigh (Right) - 65 minutes Total: Thigh (Right) - 65 minutes   DICTATION: .Note written in EPIC  PLAN OF CARE: Discharge to home after PACU  PATIENT DISPOSITION:  PACU - hemodynamically stable.   Delay start of Pharmacological VTE agent (>24hrs) due to surgical blood loss or risk of bleeding: not applicable

## 2016-07-17 NOTE — Anesthesia Preprocedure Evaluation (Signed)
Anesthesia Evaluation  Patient identified by MRN, date of birth, ID band Patient awake    Reviewed: Allergy & Precautions, NPO status , Patient's Chart, lab work & pertinent test results  Airway Mallampati: II  TM Distance: >3 FB Neck ROM: Full    Dental no notable dental hx.    Pulmonary Current Smoker,    Pulmonary exam normal breath sounds clear to auscultation       Cardiovascular negative cardio ROS Normal cardiovascular exam Rhythm:Regular Rate:Normal     Neuro/Psych negative neurological ROS  negative psych ROS   GI/Hepatic negative GI ROS, Neg liver ROS,   Endo/Other  negative endocrine ROS  Renal/GU negative Renal ROS     Musculoskeletal negative musculoskeletal ROS (+)   Abdominal   Peds  Hematology negative hematology ROS (+)   Anesthesia Other Findings   Reproductive/Obstetrics                             Anesthesia Physical Anesthesia Plan  ASA: II  Anesthesia Plan: General   Post-op Pain Management:  Regional for Post-op pain   Induction: Intravenous  Airway Management Planned: Oral ETT  Additional Equipment:   Intra-op Plan:   Post-operative Plan: Extubation in OR  Informed Consent: I have reviewed the patients History and Physical, chart, labs and discussed the procedure including the risks, benefits and alternatives for the proposed anesthesia with the patient or authorized representative who has indicated his/her understanding and acceptance.   Dental advisory given  Plan Discussed with: CRNA  Anesthesia Plan Comments:         Anesthesia Quick Evaluation

## 2016-07-17 NOTE — Progress Notes (Signed)
Attached to monitors, oxygen on at 2 L/M. 1230 Time out by Dr Renold DonGermeroth and D. Shon BatonBrooks, Charity fundraiserN. Versed and Fentanyl given for preop meds. Popiteal block started . Tolerated well. Wife at bedside.

## 2016-07-17 NOTE — H&P (Signed)
H and P  REQUESTING PHYSICIAN: Kenneth Reid, Kenneth Patrick, MD  PCP:  Patient, No Pcp Per  Chief Complaint: Right achilles tendon rupture  HPI: Kenneth Reid is a 31 y.o. male who complains of right achilles pain following a basketball injury 3 weeks prior.  He was seen and evaluated by me in my office and found to have MRI confirmed right complete achilles tendon rupture.  After extensive discussion of the risks and benefits as well as offering him nonoperative management option, he has elected for operative treatment.  He is here today for that surgery.  Past Medical History:  Diagnosis Date  . History of kidney stones    hx of  . Numbness and tingling of foot    rt ft  . Seizures (HCC)    hx of sz x1 years ago   Past Surgical History:  Procedure Laterality Date  . CYSTOSCOPY    . EYE SURGERY Bilateral 2008   laser refractory   Social History   Social History  . Marital status: Married    Spouse name: N/A  . Number of children: N/A  . Years of education: N/A   Social History Main Topics  . Smoking status: Current Some Day Smoker  . Smokeless tobacco: Never Used  . Alcohol use Yes     Comment: occasional  . Drug use: Unknown  . Sexual activity: Not Asked   Other Topics Concern  . None   Social History Narrative  . None   History reviewed. No pertinent family history. No Known Allergies Prior to Admission medications   Medication Sig Start Date End Date Taking? Authorizing Provider  acetaminophen (TYLENOL) 325 MG tablet Take 2 tablets (650 mg total) by mouth every 6 (six) hours as needed. 06/24/16  Yes Derwood KaplanNanavati, Ankit, MD  calcium carbonate (TUMS EX) 750 MG chewable tablet Chew 1 tablet by mouth as needed for heartburn.   Yes [provider]  ibuprofen (ADVIL,MOTRIN) 600 MG tablet Take 1 tablet (600 mg total) by mouth every 6 (six) hours as needed. 06/24/16  Yes Derwood KaplanNanavati, Ankit, MD  oxyCODONE-acetaminophen (PERCOCET) 7.5-325 MG per tablet Take 1 tablet  by mouth every 4 (four) hours as needed for pain. 08/02/12   Nelva NayBeaton, Robert, MD  tamsulosin (FLOMAX) 0.4 MG CAPS Take 1 capsule (0.4 mg total) by mouth daily. 08/02/12   Nelva NayBeaton, Robert, MD   No results found.  Positive ROS: All other systems have been reviewed and were otherwise negative with the exception of those mentioned in the HPI and as above.  Physical Exam: General: Alert, no acute distress Cardiovascular: No pedal edema, RRR Respiratory: No cyanosis, no use of accessory musculature, CTA B GI: No organomegaly, abdomen is soft and non-tender Skin: No lesions in the area of chief complaint Neurologic: Sensation intact distally Psychiatric: Patient is competent for consent with normal mood and affect Lymphatic: No axillary or cervical lymphadenopathy  MUSCULOSKELETAL: RLE- skin wwp with SILT throughout sur/saph/sp/dp/tn other than a little area of numbness at the level of the tear.  +thompson squeeze test, +EHL/FHL, 2+ DP  Assessment: Right achilles tendon rupture  Plan: -to OR today for open repair -plan for splint and NWB to the RLE post operatively - We again reviewed the case, the risks and benefits, including but not limited to blood clots, bleeding, infection, non healing wounds, re rupture of tendon, nerve and vessel damage, and weakness, as well as the risks of anesthesia -informed consent was provided -dc home post op from PACU.  Kenneth Kida, MD Cell [phone]    07/17/2016 12:22 PM

## 2016-07-18 ENCOUNTER — Encounter (HOSPITAL_BASED_OUTPATIENT_CLINIC_OR_DEPARTMENT_OTHER): Payer: Self-pay | Admitting: Orthopedic Surgery

## 2016-07-18 NOTE — Anesthesia Postprocedure Evaluation (Signed)
Anesthesia Post Note  Patient: Kenneth Reid  Procedure(s) Performed: Procedure(s) (LRB): ACHILLES TENDON REPAIR (Right)  Patient location during evaluation: PACU Anesthesia Type: General Level of consciousness: sedated and patient cooperative Pain management: pain level controlled Vital Signs Assessment: post-procedure vital signs reviewed and stable Respiratory status: spontaneous breathing Cardiovascular status: stable Anesthetic complications: no       Last Vitals:  Vitals:   07/17/16 1545 07/17/16 1633  BP: 133/82 126/75  Pulse: 71 74  Resp: 17 16  Temp:  36.9 C    Last Pain:  Vitals:   07/17/16 1633  TempSrc:   PainSc: 2                  Kenneth Reid

## 2018-07-04 IMAGING — DX DG ANKLE COMPLETE 3+V*R*
3 series · 3 of 3 positions shown · non-contrast
Comparison: None.

CLINICAL DATA: Posterior right ankle pain.

EXAM:
RIGHT ANKLE - COMPLETE 3+ VIEW

[x ankle ap right]
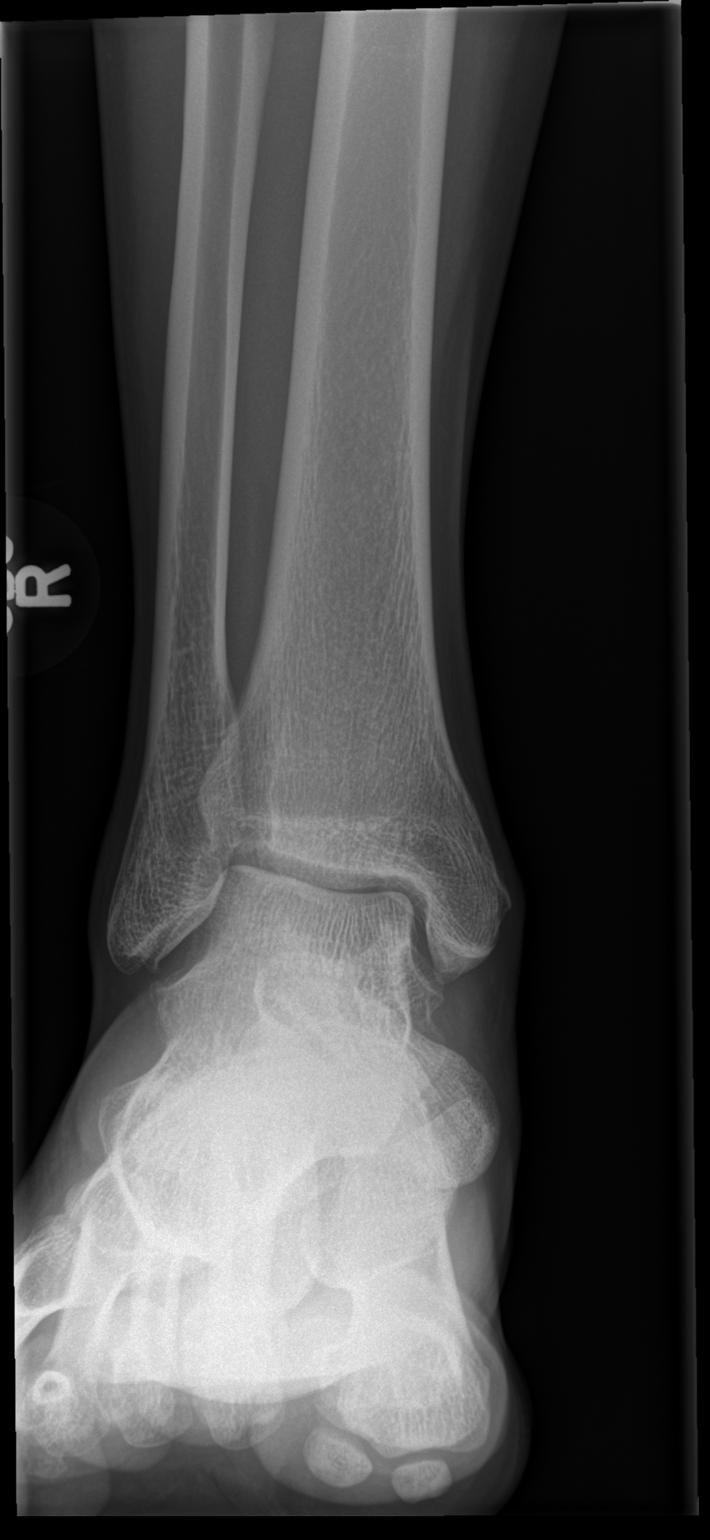

[x ankle obl right]
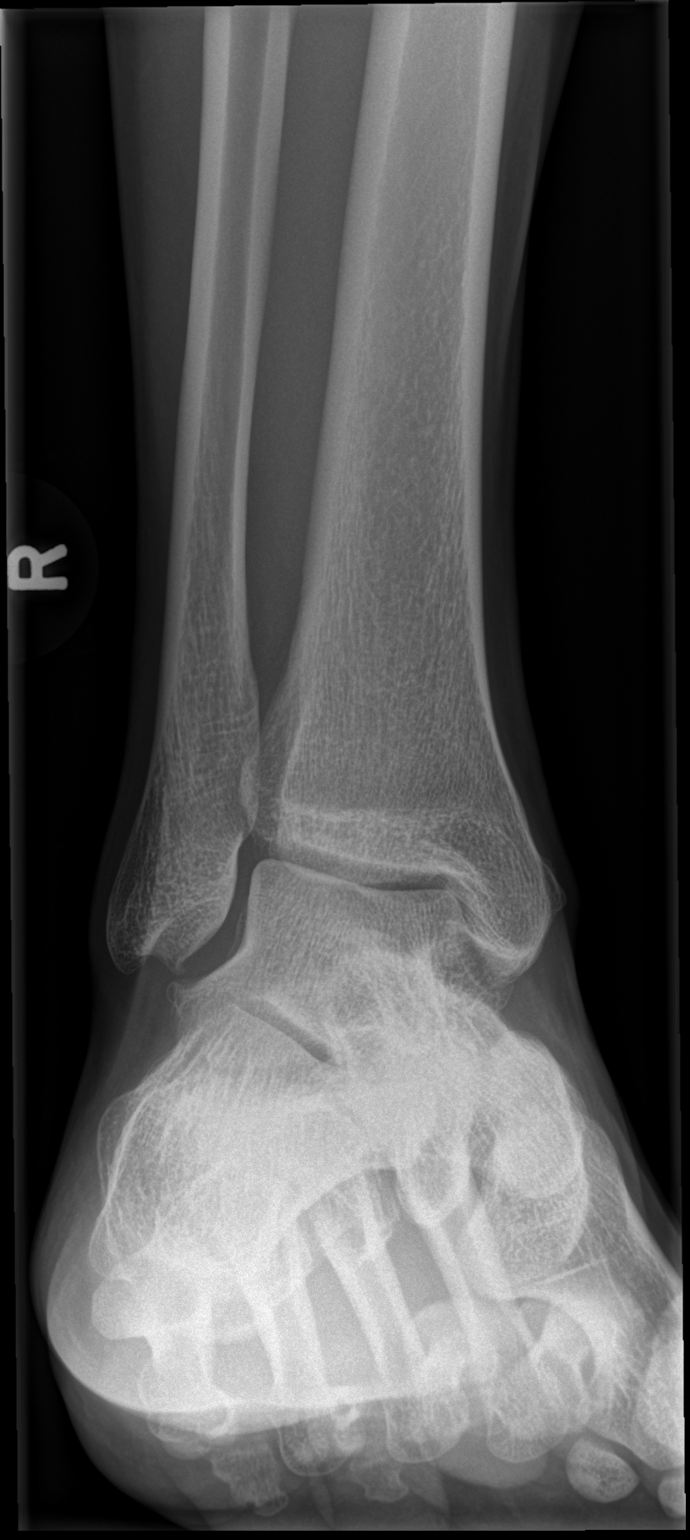

[x ankle lat right]
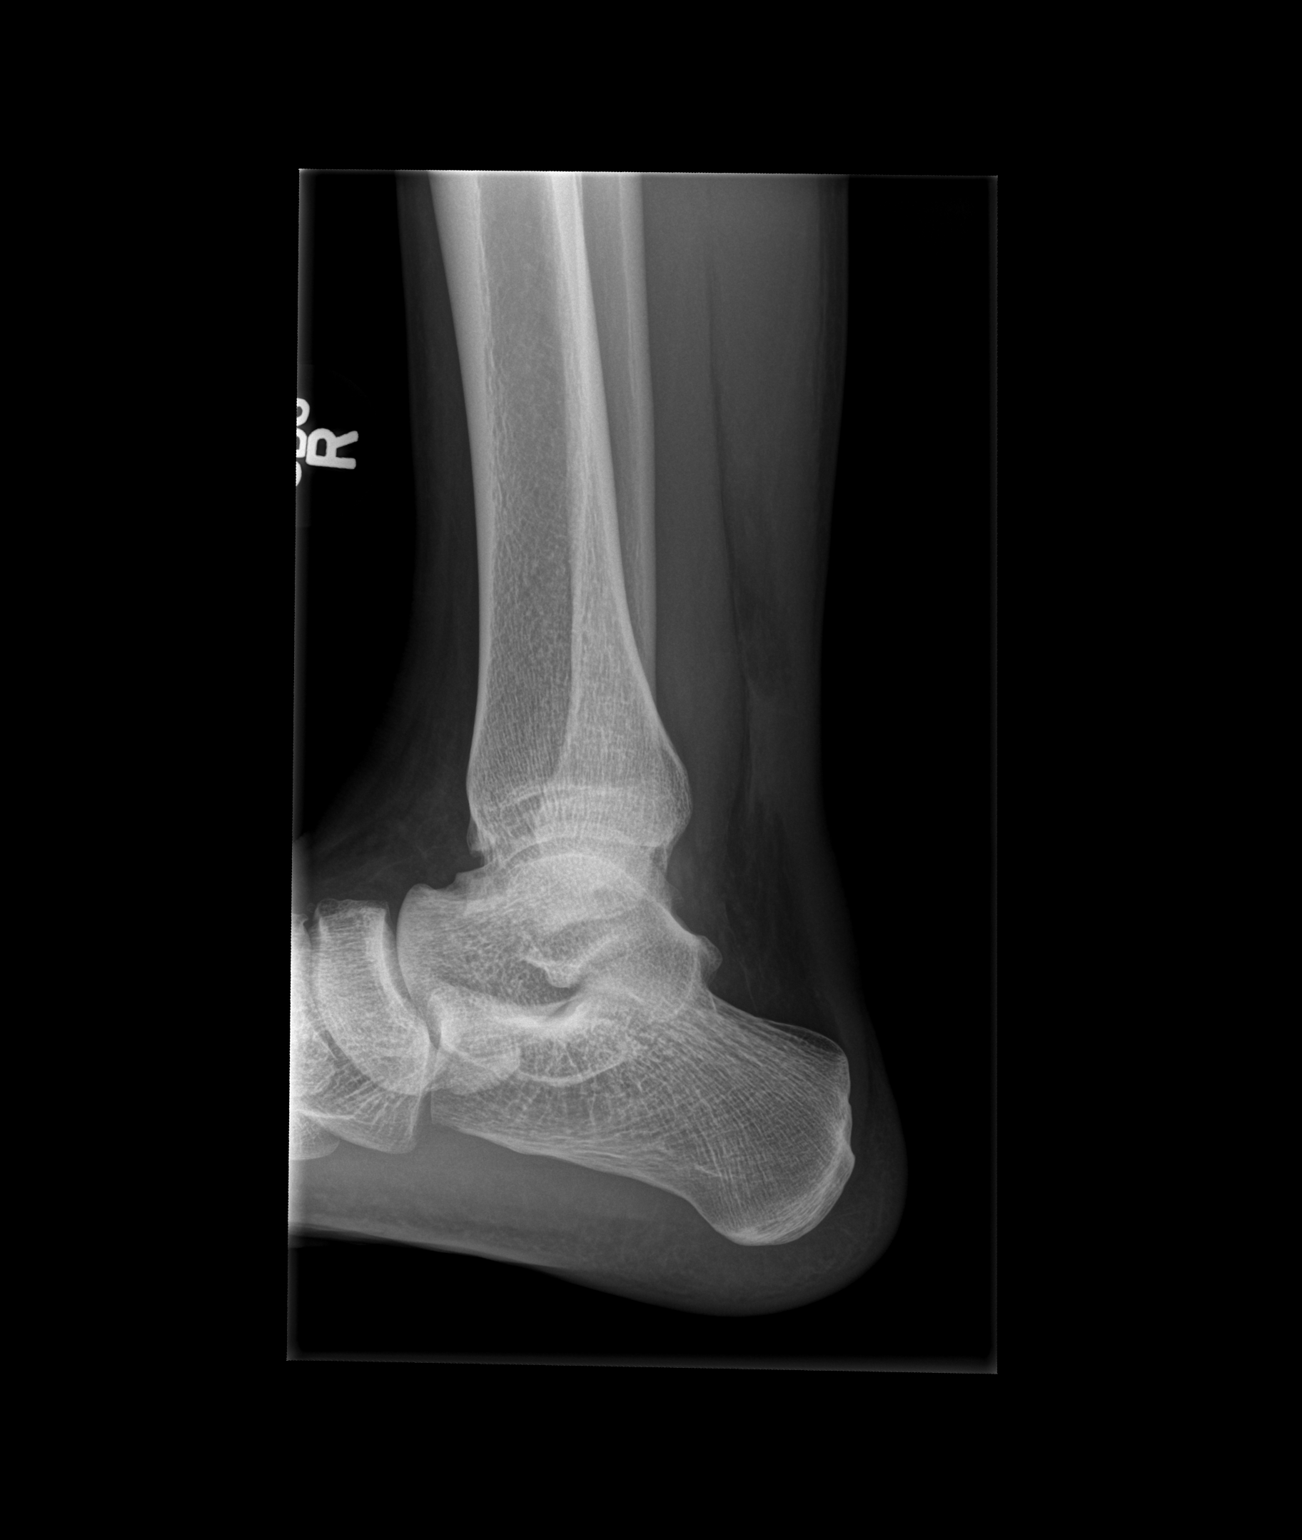

[3 of 3 positions shown; findings below may reference images not displayed]

FINDINGS: There is no evidence of fracture, dislocation, or joint effusion.
There is no evidence of arthropathy or other focal bone abnormality.
Mild soft tissue thickening is seen posteriorly, slightly superior
to the level of the ankle mortise.
IMPRESSION: No acute fracture or dislocation identified about the right ankle.

Posterior soft tissue edema.

## 2019-12-04 ENCOUNTER — Ambulatory Visit: Payer: Managed Care, Other (non HMO)
